# Patient Record
Sex: Male | Born: 1955 | Race: White | Hispanic: No | Marital: Married
Health system: Southern US, Community
[De-identification: ages and names within clinical notes are randomized; demographics above are authoritative.]

---

## 2014-01-19 ENCOUNTER — Ambulatory Visit: Payer: Self-pay | Admitting: Urology

## 2014-01-19 IMAGING — US US SCROTUM W/ DOPPLER COMPLETE
1 series · 14 of 25 positions shown · non-contrast
Comparison: None.

CLINICAL DATA: Hydrocele with enlargement

EXAM:
SCROTAL ULTRASOUND
DOPPLER ULTRASOUND OF THE TESTICLES
TECHNIQUE: Complete ultrasound examination of the testicles, epididymis, and
other scrotal structures was performed. Color and spectral Doppler
ultrasound were also utilized to evaluate blood flow to the
testicles.

[Series 1: us scrotum w/ doppler complete · 0.08mm/px · 14 of 50 slices shown]
[im 1/50]
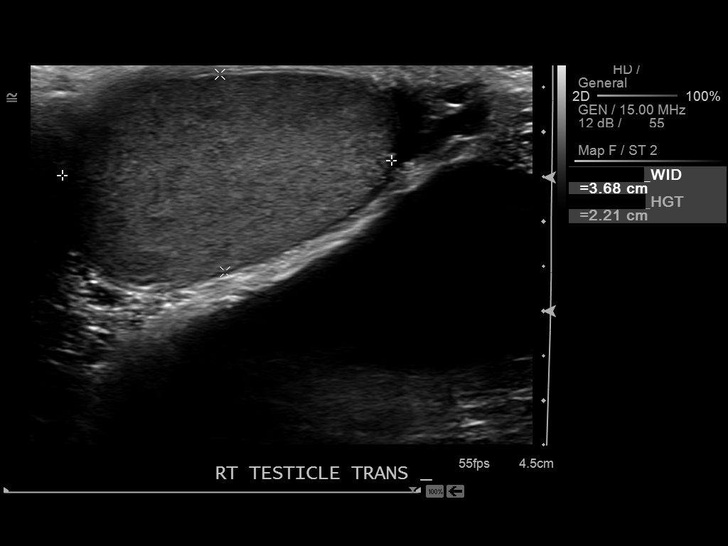
[im 5/50]
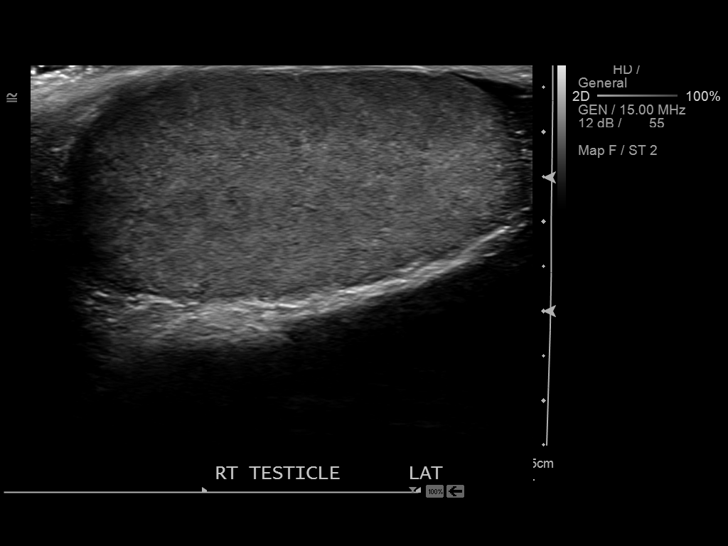
[im 9/50]
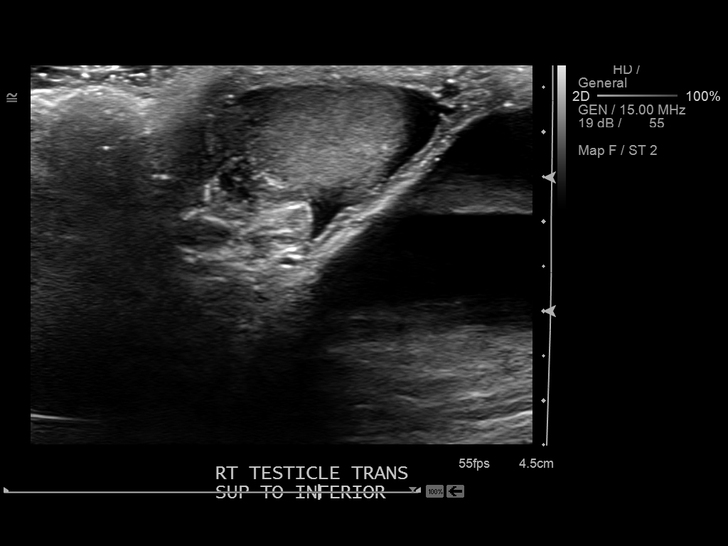
[im 13/50]
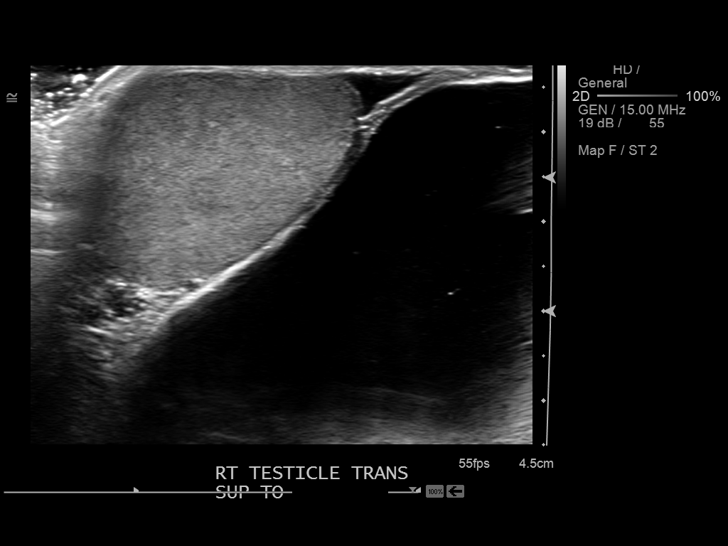
[im 17/50]
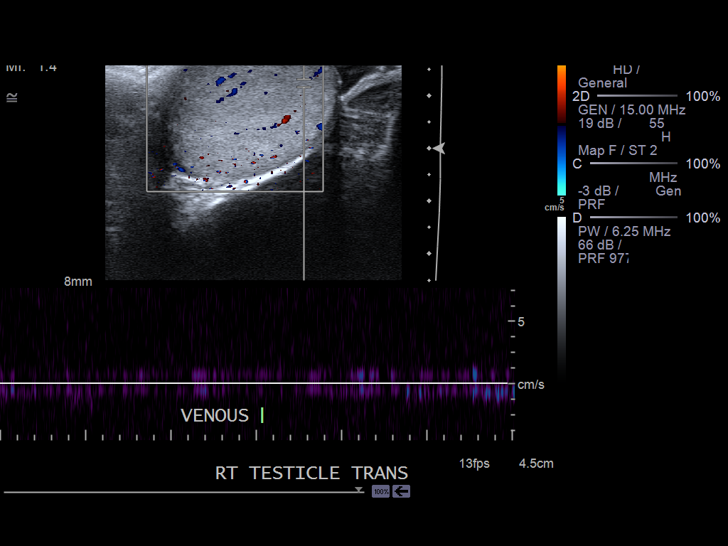
[im 19/50]
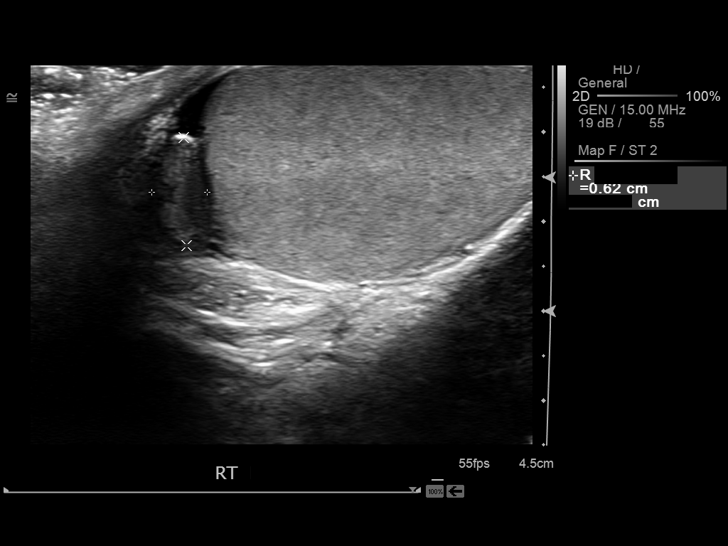
[im 23/50]
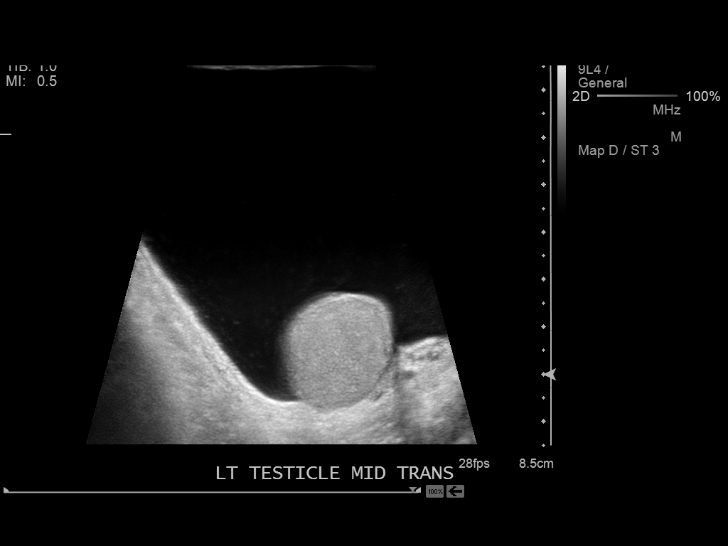
[im 27/50]
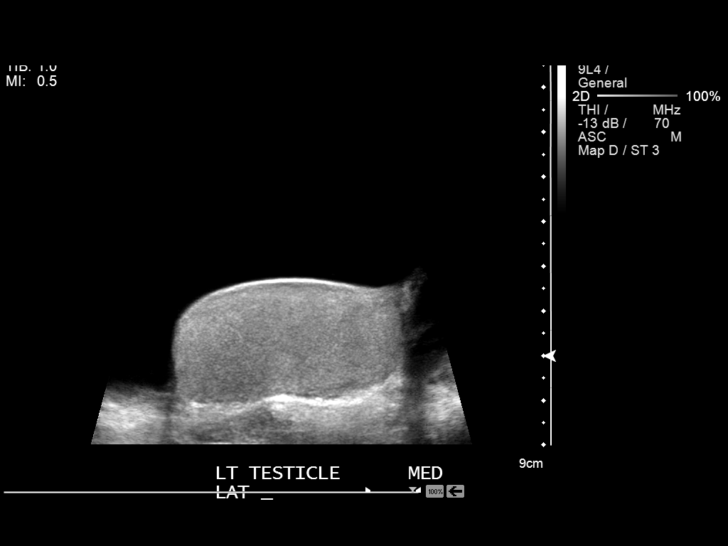
[im 31/50]
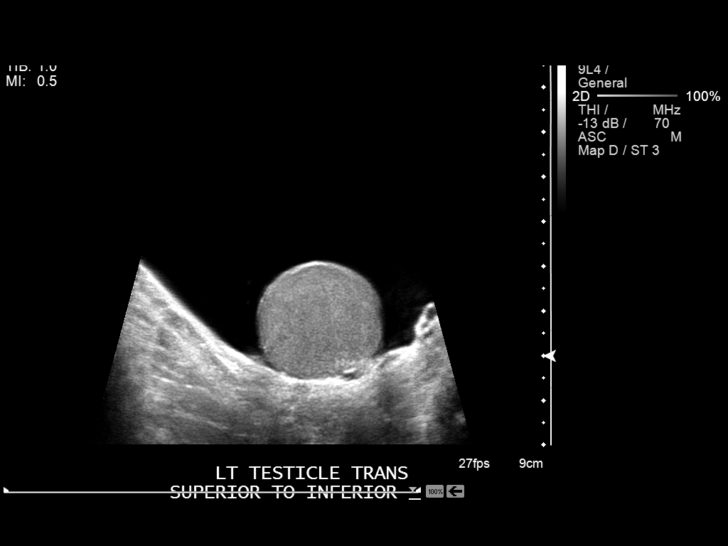
[im 33/50]
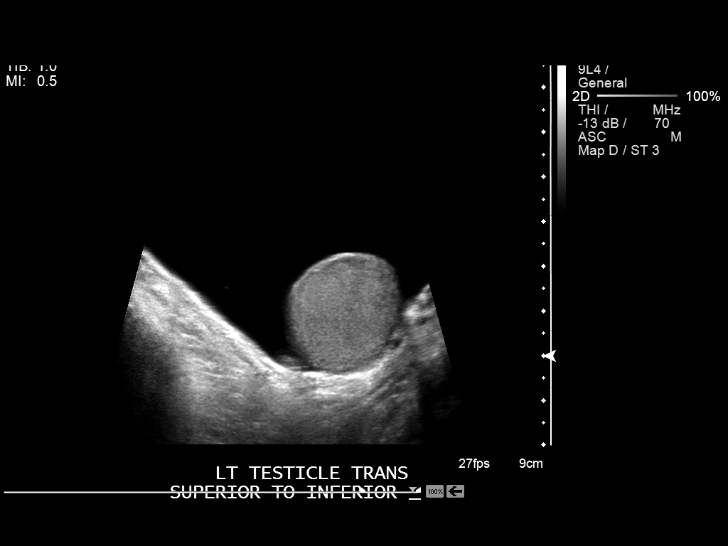
[im 37/50]
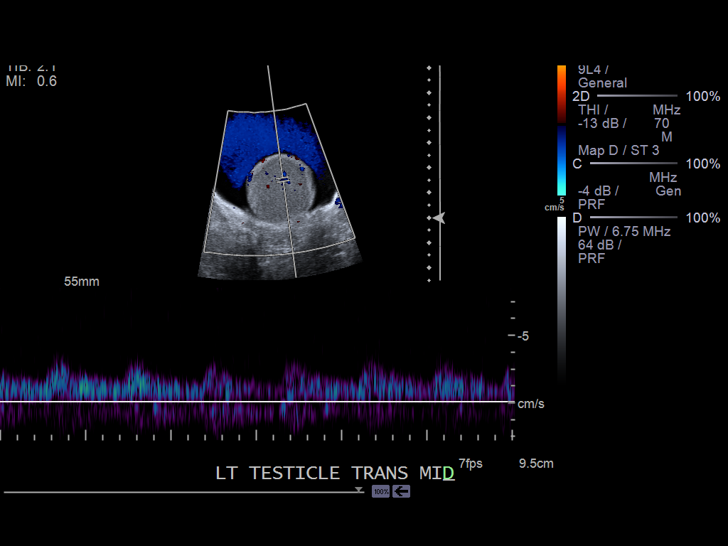
[im 41/50]
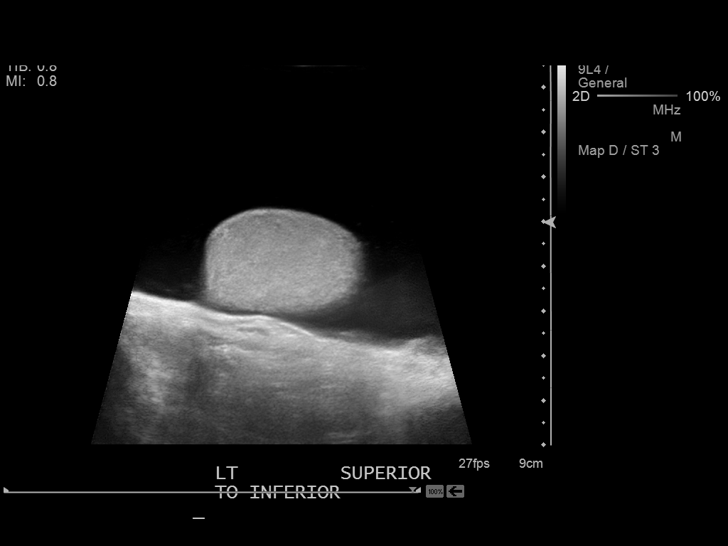
[im 45/50]
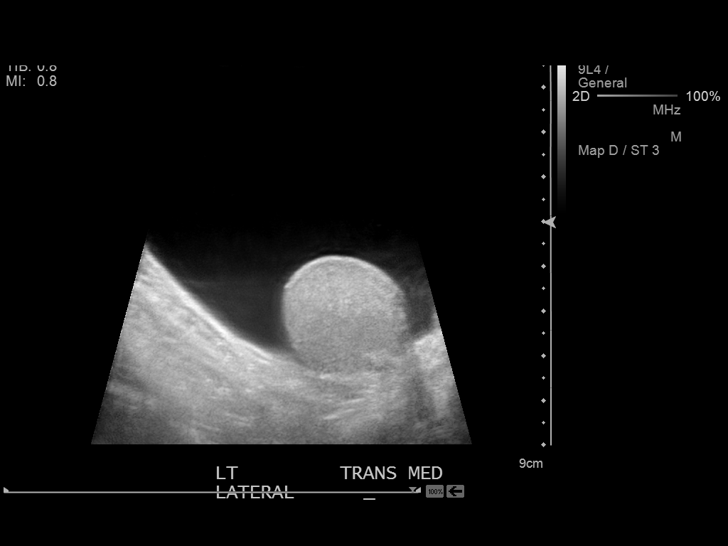
[im 50/50]
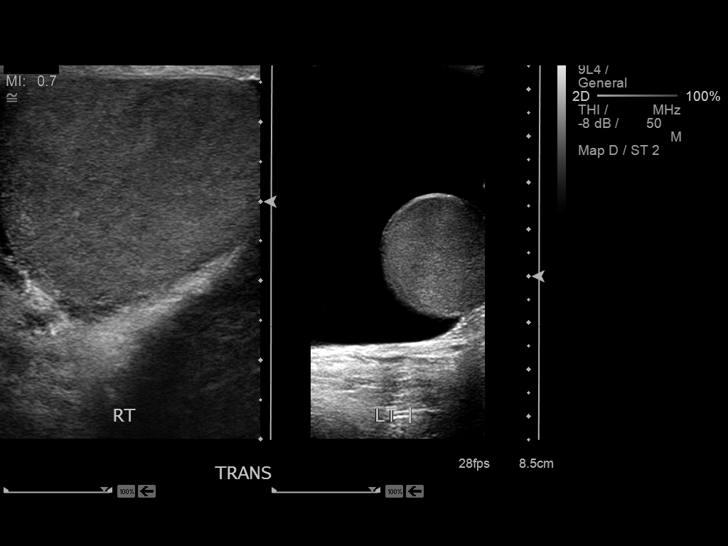

[14 of 25 positions shown; findings below may reference images not displayed]

FINDINGS: Right testicle

Measurements: 4.8 x 2.2 x 3.7 cm..  No focal lesion.

Left testicle

Measurements:  5.1 x 2.5 x 2.4 cm..  No focal lesion

Right epididymis:  Normal in size and appearance.

Left epididymis:  Normal in size and appearance.

Hydrocele: Large left hydrocele measuring approximately 10 x 6.5 x
12.5 cm. No hydrocele on the right.

Varicocele:  None visualized.

Pulsed Doppler interrogation of both testes demonstrates low
resistance arterial and venous waveforms bilaterally.
IMPRESSION: Normal appearance of the testicles themselves. Large hydrocele on
the left consistent with the clinical impression. No complicating
features.

## 2014-04-07 ENCOUNTER — Ambulatory Visit: Payer: Self-pay | Admitting: Urology

## 2014-04-07 LAB — CBC
HCT: 45.6 % (ref 40.0–52.0)
HGB: 15.3 g/dL (ref 13.0–18.0)
MCH: 31.1 pg (ref 26.0–34.0)
MCHC: 33.6 g/dL (ref 32.0–36.0)
MCV: 93 fL (ref 80–100)
Platelet: 178 10*3/uL (ref 150–440)
RBC: 4.91 10*6/uL (ref 4.40–5.90)
RDW: 12.6 % (ref 11.5–14.5)
WBC: 7 10*3/uL (ref 3.8–10.6)

## 2014-04-07 LAB — BASIC METABOLIC PANEL
ANION GAP: 6 — AB (ref 7–16)
BUN: 15 mg/dL (ref 7–18)
CHLORIDE: 108 mmol/L — AB (ref 98–107)
CREATININE: 1.09 mg/dL (ref 0.60–1.30)
Calcium, Total: 8.4 mg/dL — ABNORMAL LOW (ref 8.5–10.1)
Co2: 27 mmol/L (ref 21–32)
EGFR (Non-African Amer.): >60
Glucose: 75 mg/dL (ref 65–99)
Osmolality: 281 (ref 275–301)
Potassium: 4 mmol/L (ref 3.5–5.1)
Sodium: 141 mmol/L (ref 136–145)

## 2014-04-20 ENCOUNTER — Ambulatory Visit: Payer: Self-pay | Admitting: Urology

## 2014-10-24 NOTE — Op Note (Signed)
PATIENT NAME:  Jose Beard, [name] MR#:  [id number] DATE OF BIRTH:  12/04/1955  DATE OF PROCEDURE:  04/20/2014  PREOPERATIVE DIAGNOSIS: Left hydrocele.   POSTOPERATIVE DIAGNOSIS:  Left hydrocele.  PROCEDURE: Left hydrocelectomy.   SURGEON: Bralyn Folkert D. Edwyna ShellHart, D.O.  ANESTHESIA: General.   SPECIMEN: None.  FINDINGS:  A large left hydrocele 450 mL of fluid.   BLOOD LOSS: Zero.   COMPLICATIONS: Zero.   DESCRIPTION OF PROCEDURE: With the patient sterilely prepped and draped in supine position for ease of approach to the external genitalia, make a midline incision in the scrotum. We do this after an appropriate timeout and injecting the line of the incision with some 0.5% plain Marcaine. Carry the incision down to the hydrocele, which is to the fluid-filled sac, which turns out to be a hydrocele. We suck all the fluid out of the hydrocele.  It is 450 to 500 mL accounting for some insensible loss of the fluid.  We then incised away covering expanded tunica vaginalis. There appears to be old inflammatory tunica.  So we then incised this completely exposed testicle.  Two small epididymal cysts are fulgurated and destroyed.  Then the tissue is imbricated on itself behind the testicle and cord. I injected the cord with another 7 mL of plain Marcaine. I next sutured the tunica vaginalis back close together and with a contiguous suture subcuticular fashion with 2-0 Vicryl, I put the scrotal skin back together. No drain is necessary.  There was no bleeding. Any minor oozing was controlled with electrocautery. He tolerates the procedure well. Sent to recovery in satisfactory condition with testicle now clear of the hydrocele.   ____________________________ Caralyn Guileichard D. Edwyna ShellHart, DO [id number] D: 04/20/2014 17:00:28 ET T: 04/20/2014 18:17:39 ET JOB#: [id number]  cc: Caralyn Guileichard D. Edwyna ShellHart, DO, <Dictator> Miroslav Gin D Jaelin Fackler DO ELECTRONICALLY SIGNED 04/24/2014 14:10
